# Patient Record
Sex: Male | Born: 1990 | Race: White | Hispanic: No | Marital: Single | Smoking: Never smoker
Health system: Southern US, Community
[De-identification: ages and names within clinical notes are randomized; demographics above are authoritative.]

## PROBLEM LIST (undated history)

## (undated) HISTORY — PX: APPENDECTOMY: SHX54

---
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
## 2014-05-11 ENCOUNTER — Ambulatory Visit: Payer: Self-pay

## 2015-01-15 ENCOUNTER — Emergency Department
Admission: EM | Admit: 2015-01-15 | Discharge: 2015-01-15 | Disposition: A | Discharge status: Home / Self Care | Payer: Self-pay | Attending: Emergency Medicine | Admitting: Emergency Medicine

## 2015-01-15 ENCOUNTER — Other Ambulatory Visit: Payer: Self-pay

## 2015-01-15 ENCOUNTER — Encounter: Payer: Self-pay | Admitting: Emergency Medicine

## 2015-01-15 ENCOUNTER — Emergency Department: Payer: Self-pay

## 2015-01-15 DIAGNOSIS — Y9289 Other specified places as the place of occurrence of the external cause: Secondary | ICD-10-CM | POA: Insufficient documentation

## 2015-01-15 DIAGNOSIS — R1110 Vomiting, unspecified: Secondary | ICD-10-CM | POA: Insufficient documentation

## 2015-01-15 DIAGNOSIS — Y998 Other external cause status: Secondary | ICD-10-CM | POA: Insufficient documentation

## 2015-01-15 DIAGNOSIS — R42 Dizziness and giddiness: Secondary | ICD-10-CM | POA: Insufficient documentation

## 2015-01-15 DIAGNOSIS — R197 Diarrhea, unspecified: Secondary | ICD-10-CM | POA: Insufficient documentation

## 2015-01-15 DIAGNOSIS — Y9389 Activity, other specified: Secondary | ICD-10-CM | POA: Insufficient documentation

## 2015-01-15 DIAGNOSIS — J029 Acute pharyngitis, unspecified: Secondary | ICD-10-CM | POA: Insufficient documentation

## 2015-01-15 LAB — CBC WITH DIFFERENTIAL/PLATELET
Basophils Absolute: 0.1 10*3/uL (ref 0–0.1)
Basophils Relative: 1 %
Eosinophils Absolute: 0 10*3/uL (ref 0–0.7)
Eosinophils Relative: 0 %
HCT: 38.4 % — ABNORMAL LOW (ref 40.0–52.0)
Hemoglobin: 13.2 g/dL (ref 13.0–18.0)
LYMPHS ABS: 0.5 10*3/uL — AB (ref 1.0–3.6)
LYMPHS PCT: 5 %
MCH: 28.4 pg (ref 26.0–34.0)
MCHC: 34.5 g/dL (ref 32.0–36.0)
MCV: 82.4 fL (ref 80.0–100.0)
MONO ABS: 0.7 10*3/uL (ref 0.2–1.0)
Monocytes Relative: 8 %
NEUTROS ABS: 7.9 10*3/uL — AB (ref 1.4–6.5)
NEUTROS PCT: 86 %
PLATELETS: 199 10*3/uL (ref 150–440)
RBC: 4.66 MIL/uL (ref 4.40–5.90)
RDW: 13.3 % (ref 11.5–14.5)
WBC: 9.1 10*3/uL (ref 3.8–10.6)

## 2015-01-15 LAB — BASIC METABOLIC PANEL
Anion gap: 8 (ref 5–15)
BUN: 11 mg/dL (ref 6–20)
CALCIUM: 8.6 mg/dL — AB (ref 8.9–10.3)
CO2: 24 mmol/L (ref 22–32)
CREATININE: 0.98 mg/dL (ref 0.61–1.24)
Chloride: 101 mmol/L (ref 101–111)
GFR calc Af Amer: >60 mL/min (ref >60)
GLUCOSE: 104 mg/dL — AB (ref 65–99)
POTASSIUM: 3.7 mmol/L (ref 3.5–5.1)
SODIUM: 133 mmol/L — AB (ref 135–145)

## 2015-01-15 LAB — MONONUCLEOSIS SCREEN: MONO SCREEN: NEGATIVE

## 2015-01-15 MED ORDER — SODIUM CHLORIDE 0.9 % IV SOLN
Freq: Once | INTRAVENOUS | Status: AC
  Administered 2015-01-15: 19:00:00 via INTRAVENOUS

## 2015-01-15 MED ORDER — IBUPROFEN 800 MG PO TABS: 800.0000 mg | ORAL_TABLET | Freq: Three times a day (TID) | ORAL | Status: AC | PRN

## 2015-01-15 MED ORDER — SODIUM CHLORIDE 0.9 % IV BOLUS (SEPSIS)
1000.0000 mL | Freq: Once | INTRAVENOUS | Status: AC
  Administered 2015-01-15: 1000 mL via INTRAVENOUS

## 2015-01-15 MED ORDER — ONDANSETRON 4 MG PO TBDP
4.0000 mg | ORAL_TABLET | Freq: Once | ORAL | Status: AC
  Administered 2015-01-15: 4 mg via ORAL

## 2015-01-15 MED ORDER — LORAZEPAM 2 MG/ML IJ SOLN
1.0000 mg | Freq: Once | INTRAMUSCULAR | Status: DC
  Filled 2015-01-15: qty 1

## 2015-01-15 MED ORDER — ONDANSETRON HCL 4 MG/2ML IJ SOLN
INTRAMUSCULAR | Status: AC
Start: 2015-01-15 — End: 2015-01-15
  Administered 2015-01-15: 4 mg via INTRAVENOUS
  Filled 2015-01-15: qty 2

## 2015-01-15 MED ORDER — ONDANSETRON 4 MG PO TBDP
ORAL_TABLET | ORAL | Status: AC
Start: 2015-01-15 — End: 2015-01-15
  Administered 2015-01-15: 4 mg via ORAL
  Filled 2015-01-15: qty 1

## 2015-01-15 MED ORDER — ONDANSETRON HCL 4 MG/2ML IJ SOLN: 4.0000 mg | Freq: Once | INTRAMUSCULAR | Status: AC

## 2015-01-15 MED ORDER — ONDANSETRON HCL 4 MG/2ML IJ SOLN
4.0000 mg | Freq: Once | INTRAMUSCULAR | Status: AC
  Administered 2015-01-15: 4 mg via INTRAVENOUS

## 2015-01-15 MED ORDER — ONDANSETRON HCL 4 MG PO TABS: 4.0000 mg | ORAL_TABLET | Freq: Every day | ORAL | Status: AC | PRN

## 2015-01-15 NOTE — ED Notes (Signed)
Pt states he was bit by several yellow jackets yesterday. Pt presents today with fever, dizziness. No resp distress noted

## 2015-01-15 NOTE — ED Notes (Signed)
Lab notified of orders 

## 2015-01-15 NOTE — ED Notes (Signed)
Dr Williams at bedside 

## 2015-01-15 NOTE — ED Provider Notes (Signed)
[facility] Emergency Department Provider Note     Time seen: ----------------------------------------- 6:57 PM on 01/15/2015 -----------------------------------------    I have reviewed the triage vital signs and the nursing notes.   HISTORY  Chief Complaint Insect Bite    HPI Dustin ItoJustin C Dunford Jr. is a 24 y.o. male who presents ER for multiple symptoms including fever, dizziness, room spinning, lightheadedness, vomiting on arrival here. Patient also has had copious diarrhea since this morning. Fever was 102. Patient reports that usually he has bad sore throats, he also sore throat now. Patient initially concerned about being stung by 3 yellow jackets yesterday and some association with his symptoms and the yellowjacket stings. Symptoms are mild to moderate this time.   History reviewed. No pertinent past medical history.  There are no active problems to display for this patient.   Past Surgical History  Procedure Laterality Date  . Appendectomy      Allergies Cephalosporins and Phenergan  Social History History  Substance Use Topics  . Smoking status: Never Smoker   . Smokeless tobacco: Not on file  . Alcohol Use: Yes    Review of Systems Constitutional: Positive for fever Eyes: Negative for visual changes. ENT: Positive for sore throat Cardiovascular: Negative for chest pain. Respiratory: Negative for shortness of breath. Gastrointestinal: Negative for abdominal pain, positive for vomiting and diarrhea Genitourinary: Negative for dysuria. Musculoskeletal: Negative for back pain. Skin: Negative for rash. Neurological: Negative for headaches, positive for weakness and dizziness  10-point ROS otherwise negative.  ____________________________________________   PHYSICAL EXAM:  VITAL SIGNS: ED Triage Vitals  Enc Vitals Group     BP 01/15/15 1809 129/84 mmHg     Pulse Rate 01/15/15 1809 111     Resp --      Temp 01/15/15 1809  102.2 F (39 C)     Temp Source 01/15/15 1809 Oral     SpO2 01/15/15 1809 94 %     Weight 01/15/15 1809 280 lb (127.007 kg)     Height 01/15/15 1809 6\' 4"  (1.93 m)     Head Cir --      Peak Flow --      Pain Score 01/15/15 1812 7     Pain Loc --      Pain Edu? --      Excl. in GC? --     Constitutional: Alert and oriented. Well mild distress, actively vomiting on arrival Eyes: Conjunctivae are normal. PERRL. Normal extraocular movements. ENT   Head: Normocephalic and atraumatic.   Nose: No congestion/rhinnorhea.   Mouth/Throat: Mucous membranes are moist. Markedly tongue or hypertrophy with erythema and exudate   Neck: No stridor. Hematological/Lymphatic/Immunilogical: Mild to moderate cervical adenopathy Cardiovascular: Rapid rate, regular rhythm Normal and symmetric distal pulses are present in all extremities. No murmurs, rubs, or gallops. Respiratory: Normal respiratory effort without tachypnea nor retractions. Breath sounds are clear and equal bilaterally. No wheezes/rales/rhonchi. Gastrointestinal: Soft and nontender. No distention. No abdominal bruits. There is no CVA tenderness. Musculoskeletal: Nontender with normal range of motion in all extremities. No joint effusions.  No lower extremity tenderness nor edema. Neurologic:  Normal speech and language. No gross focal neurologic deficits are appreciated. Speech is normal. No gait instability. Skin:  Skin is warm, dry and intact. No rash noted. Psychiatric: Mood and affect are normal. Speech and behavior are normal. Patient exhibits appropriate insight and judgment. Patient does appear anxious  ____________________________________________  ED COURSE:  Pertinent labs & imaging results that were available  during my care of the patient were reviewed by me and considered in my medical decision making (see chart for details). I do not see any association between the jacket sting in his symptoms today. Possible  gastroenteritis or strep pharyngitis. Patient received normal saline bolus as well as Zofran and Ativan. ____________________________________________ EKG: EKG with normal sinus rhythm, normal axis normal intervals. No evidence of hypertrophy or acute infarction. Rate is 93 bpm   LABS (pertinent positives/negatives)  Labs Reviewed  CBC WITH DIFFERENTIAL/PLATELET - Abnormal; Notable for the following:    HCT 38.4 (*)    Neutro Abs 7.9 (*)    Lymphs Abs 0.5 (*)    All other components within normal limits  BASIC METABOLIC PANEL - Abnormal; Notable for the following:    Sodium 133 (*)    Glucose, Bld 104 (*)    Calcium 8.6 (*)    All other components within normal limits  CULTURE, GROUP A STREP ([facility] ONLY)  MONONUCLEOSIS SCREEN    RADIOLOGY Images were viewed by me  Chest x-ray is unremarkable  ____________________________________________  FINAL ASSESSMENT AND PLAN  Fever, vomiting and diarrhea, pharyngitis  Plan: Patient received saline bolus as well as IV Zofran and Ativan. Tests were negative grossly here. Likely nor virus related. Patient be discharged with antiemetics and antidiarrheal agents. He stable for outpatient follow-up with his doctor.   Emily Filbert, MD   Emily Filbert, MD 01/15/15 [phone]

## 2015-01-15 NOTE — ED Notes (Signed)
Pt took IBU and Benedryl around 515.

## 2015-01-15 NOTE — Discharge Instructions (Signed)
Norovirus Infection Norovirus illness is caused by a viral infection. The term norovirus refers to a group of viruses. Any of those viruses can cause norovirus illness. This illness is often referred to by other names such as viral gastroenteritis, stomach flu, and food poisoning. Anyone can get a norovirus infection. People can have the illness multiple times during their lifetime. CAUSES  Norovirus is found in the stool or vomit of infected people. It is easily spread from person to person (contagious). People with norovirus are contagious from the moment they begin feeling ill. They may remain contagious for as long as 3 days to 2 weeks after recovery. People can become infected with the virus in several ways. This includes:  Eating food or drinking liquids that are contaminated with norovirus.  Touching surfaces or objects contaminated with norovirus, and then placing your hand in your mouth.  Having direct contact with a person who is infected and shows symptoms. This may occur while caring for someone with illness or while sharing foods or eating utensils with someone who is ill. SYMPTOMS  Symptoms usually begin 1 to 2 days after ingestion of the virus. Symptoms may include:  Nausea.  Vomiting.  Diarrhea.  Stomach cramps.  Low-grade fever.  Chills.  Headache.  Muscle aches.  Tiredness. Most people with norovirus illness get better within 1 to 2 days. Some people become dehydrated because they cannot drink enough liquids to replace those lost from vomiting and diarrhea. This is especially true for young children, the elderly, and others who are unable to care for themselves. DIAGNOSIS  Diagnosis is based on your symptoms and exam. Currently, only state public health laboratories have the ability to test for norovirus in stool or vomit. TREATMENT  No specific treatment exists for norovirus infections. No vaccine is available to prevent infections. Norovirus illness is usually  brief in healthy people. If you are ill with vomiting and diarrhea, you should drink enough water and fluids to keep your urine clear or pale yellow. Dehydration is the most serious health effect that can result from this infection. By drinking oral rehydration solution (ORS), people can reduce their chance of becoming dehydrated. There are many commercially available pre-made and powdered ORS designed to safely rehydrate people. These may be recommended by your caregiver. Replace any new fluid losses from diarrhea or vomiting with ORS as follows:  If your child weighs 10 kg or less (22 lb or less), give 60 to 120 ml ( to  cup or 2 to 4 oz) of ORS for each diarrheal stool or vomiting episode.  If your child weighs more than 10 kg (more than 22 lb), give 120 to 240 ml ( to 1 cup or 4 to 8 oz) of ORS for each diarrheal stool or vomiting episode. HOME CARE INSTRUCTIONS   Follow all your caregiver's instructions.  Avoid sugar-free and alcoholic drinks while ill.  Only take over-the-counter or prescription medicines for pain, vomiting, diarrhea, or fever as directed by your caregiver. You can decrease your chances of coming in contact with norovirus or spreading it by following these steps:  Frequently wash your hands, especially after using the toilet, changing diapers, and before eating or preparing food.  Carefully wash fruits and vegetables. Cook shellfish before eating them.  Do not prepare food for others while you are infected and for at least 3 days after recovering from illness.  Thoroughly clean and disinfect contaminated surfaces immediately after an episode of illness using a bleach-based household cleaner.    Immediately remove and wash clothing or linens that may be contaminated with the virus.  Use the toilet to dispose of any vomit or stool. Make sure the surrounding area is kept clean.  Food that may have been contaminated by an ill person should be discarded. SEEK IMMEDIATE  MEDICAL CARE IF:   You develop symptoms of dehydration that do not improve with fluid replacement. This may include:  Excessive sleepiness.  Lack of tears.  Dry mouth.  Dizziness when standing.  Weak pulse. Document Released: 09/11/2002 Document Revised: 09/13/2011 Document Reviewed: 10/13/2009 Exit[employer] Patient Information 2015 [employer]. This information is not intended to replace advice given to you by your health care provider. Make sure you discuss any questions you have with your health care provider.  

## 2015-01-15 NOTE — ED Notes (Signed)
Strep POC NEGATIVE

## 2015-01-18 LAB — CULTURE, GROUP A STREP (THRC)

## 2021-04-23 ENCOUNTER — Ambulatory Visit: Payer: Self-pay | Admitting: Dermatology

## 2022-11-16 ENCOUNTER — Ambulatory Visit
Admission: RE | Admit: 2022-11-16 | Discharge: 2022-11-16 | Disposition: A | Discharge status: Home / Self Care | Payer: BLUE CROSS/BLUE SHIELD | Source: Ambulatory Visit | Attending: Emergency Medicine | Admitting: Emergency Medicine

## 2022-11-16 VITALS — BP 149/91 | HR 89 | Temp 97.7°F | Resp 18

## 2022-11-16 DIAGNOSIS — R062 Wheezing: Secondary | ICD-10-CM

## 2022-11-16 DIAGNOSIS — J069 Acute upper respiratory infection, unspecified: Secondary | ICD-10-CM | POA: Diagnosis not present

## 2022-11-16 DIAGNOSIS — R051 Acute cough: Secondary | ICD-10-CM

## 2022-11-16 MED ORDER — IPRATROPIUM BROMIDE 0.06 % NA SOLN: 2.0000 | Freq: Four times a day (QID) | NASAL | 12 refills | Status: AC

## 2022-11-16 MED ORDER — BENZONATATE 100 MG PO CAPS: 200.0000 mg | ORAL_CAPSULE | Freq: Three times a day (TID) | ORAL | 0 refills | Status: AC

## 2022-11-16 MED ORDER — AMOXICILLIN-POT CLAVULANATE 875-125 MG PO TABS: 1.0000 | ORAL_TABLET | Freq: Two times a day (BID) | ORAL | 0 refills | Status: AC

## 2022-11-16 MED ORDER — ALBUTEROL SULFATE HFA 108 (90 BASE) MCG/ACT IN AERS: 2.0000 | INHALATION_SPRAY | RESPIRATORY_TRACT | 0 refills | Status: AC | PRN

## 2022-11-16 MED ORDER — AEROCHAMBER MV MISC: 2 refills | Status: AC

## 2022-11-16 NOTE — ED Provider Notes (Signed)
[facility]    CSN: [id number] Arrival date & time: 11/16/22  1546      History   Chief Complaint Chief Complaint  Patient presents with   Cough    Ongoing cough with congestion and wheezing at night time - Entered by patient   Wheezing    HPI Dustin Payne. is a 32 y.o. male.   HPI  32 year old male with no significant past medical history presents for evaluation of 3 weeks worth of runny nose, nasal congestion, yellow nasal discharge, productive cough yellow sputum, shortness breath, and wheezing.  He denies fever, sinus pain, or headache.  He has been using a friend's DuoNeb to help with wheezing at night with relief of symptoms.  No history of asthma.  History reviewed. No pertinent past medical history.  There are no problems to display for this patient.   Past Surgical History:  Procedure Laterality Date   APPENDECTOMY         Home Medications    Prior to Admission medications   Medication Sig Start Date End Date Taking? Authorizing Provider  albuterol (VENTOLIN HFA) 108 (90 Base) MCG/ACT inhaler Inhale 2 puffs into the lungs every 4 (four) hours as needed. 11/16/22  Yes Becky Augusta, NP  amoxicillin-clavulanate (AUGMENTIN) 875-125 MG tablet Take 1 tablet by mouth every 12 (twelve) hours for 10 days. 11/16/22 11/26/22 Yes Becky Augusta, NP  benzonatate (TESSALON) 100 MG capsule Take 2 capsules (200 mg total) by mouth every 8 (eight) hours. 11/16/22  Yes Becky Augusta, NP  ipratropium (ATROVENT) 0.06 % nasal spray Place 2 sprays into both nostrils 4 (four) times daily. 11/16/22  Yes Becky Augusta, NP  Spacer/Aero-Holding Chambers (AEROCHAMBER MV) inhaler Use as instructed 11/16/22  Yes Becky Augusta, NP  ibuprofen (ADVIL,MOTRIN) 800 MG tablet Take 1 tablet (800 mg total) by mouth every 8 (eight) hours as needed. 01/15/15   Emily Filbert, MD  ondansetron (ZOFRAN) 4 MG tablet Take 1 tablet (4 mg total) by mouth daily as needed for nausea or vomiting.  01/15/15   Emily Filbert, MD    Family History History reviewed. No pertinent family history.  Social History Social History   Tobacco Use   Smoking status: Never   Smokeless tobacco: Never  Vaping Use   Vaping Use: Never used  Substance Use Topics   Alcohol use: Not Currently    Comment: last drink 12 weeks ago.   Drug use: Never     Allergies   Cefprozil, Cephalosporins, and Phenergan [promethazine hcl]   Review of Systems Review of Systems  Constitutional:  Negative for fever.  HENT:  Positive for congestion and rhinorrhea. Negative for ear pain, sinus pain and sore throat.   Respiratory:  Positive for cough, shortness of breath and wheezing.      Physical Exam Triage Vital Signs ED Triage Vitals [11/16/22 1609]  Enc Vitals Group     BP (!) 149/91     Pulse Rate 89     Resp 18     Temp 97.7 F (36.5 C)     Temp Source Temporal     SpO2 94 %     Weight      Height      Head Circumference      Peak Flow      Pain Score 0     Pain Loc      Pain Edu?      Excl. in GC?    No data found.  Updated Vital Signs BP (!) 149/91 (BP Location: Left Arm)   Pulse 89   Temp 97.7 F (36.5 C) (Temporal)   Resp 18   SpO2 94%   Visual Acuity Right Eye Distance:   Left Eye Distance:   Bilateral Distance:    Right Eye Near:   Left Eye Near:    Bilateral Near:     Physical Exam Vitals and nursing note reviewed.  Constitutional:      Appearance: Normal appearance. He is not ill-appearing.  HENT:     Head: Normocephalic and atraumatic.     Right Ear: Tympanic membrane, ear canal and external ear normal. There is no impacted cerumen.     Left Ear: Tympanic membrane, ear canal and external ear normal. There is no impacted cerumen.     Nose: Congestion and rhinorrhea present.     Comments: Nasal mucosa is erythematous and edematous with yellow discharge in both nares.    Mouth/Throat:     Mouth: Mucous membranes are moist.     Pharynx: Oropharynx is  clear. Posterior oropharyngeal erythema present. No oropharyngeal exudate.     Comments: Erythema to the posterior oropharynx with yellow postnasal drip.  No injection. Cardiovascular:     Rate and Rhythm: Normal rate and regular rhythm.     Pulses: Normal pulses.     Heart sounds: Normal heart sounds. No murmur heard.    No friction rub. No gallop.  Pulmonary:     Effort: Pulmonary effort is normal.     Breath sounds: Normal breath sounds. No wheezing, rhonchi or rales.  Musculoskeletal:     Cervical back: Normal range of motion and neck supple.  Lymphadenopathy:     Cervical: No cervical adenopathy.  Skin:    General: Skin is warm and dry.     Capillary Refill: Capillary refill takes less than 2 seconds.     Findings: No erythema or rash.  Neurological:     General: No focal deficit present.     Mental Status: He is alert and oriented to person, place, and time.      UC Treatments / Results  Labs (all labs ordered are listed, but only abnormal results are displayed) Labs Reviewed - No data to display  EKG   Radiology No results found.  Procedures Procedures (including critical care time)  Medications Ordered in UC Medications - No data to display  Initial Impression / Assessment and Plan / UC Course  I have reviewed the triage vital signs and the nursing notes.  Pertinent labs & imaging results that were available during my care of the patient were reviewed by me and considered in my medical decision making (see chart for details).   Patient is a nontoxic-appearing 32 year old male presenting for evaluation of 3 for the respiratory symptoms as outlined in HPI above.  The patient is not in any respiratory distress and he can speak in full sentence without dyspnea or tachypnea.  His respiratory rate is 18 and his room air oxygen saturation is 94%.  His symptoms have gone in conjunction with upper respiratory symptoms and he initially attributed this to allergies.  He  states when the pollen count is high his respiratory symptoms are worse but he has no history of asthma.  He does have inflamed nasal mucosa with purulent nasal discharge and yellow postnasal drip.  His lungs are clear to auscultation.  I suspect that he has an upper respiratory infection and the drainage is what is causing his  lower respiratory symptoms.  He has an allergy to cephalosporins but he states he can take penicillin fine so I will put him on Augmentin 875 twice daily for 10 days for treatment of an upper respiratory infection.  I will prescribe albuterol inhaler and spacer that he can use every 4-6 hours as needed for shortness of breath or wheezing.  I have also prescribed Tessalon Perles and Atrovent nasal spray to help with cough and nasal congestion.  He has an allergy to Promethazine DM cough syrup so I recommended he use over-the-counter cough preparations such as Delsym, Robitussin, or Zarbee's.  Return precautions reviewed.   Final Clinical Impressions(s) / UC Diagnoses   Final diagnoses:  Upper respiratory tract infection, unspecified type  Acute cough  Wheezing     Discharge Instructions      The Augmentin twice daily with food for 10 days for treatment of your uri.  Perform sinus irrigation 2-3 times a day with a NeilMed sinus rinse kit and distilled water.  Do not use tap water.  You can use plain over-the-counter Mucinex every 6 hours to break up the stickiness of the mucus so your body can clear it.  Increase your oral fluid intake to thin out your mucus so that is also able for your body to clear more easily.  Take an over-the-counter probiotic, such as Culturelle-align-activia, 1 hour after each dose of antibiotic to prevent diarrhea.  Use the Atrovent nasal spray, 2 squirts in each nostril every 6 hours, as needed for runny nose and postnasal drip.  Use the Tessalon Perles every 8 hours during the day.  Take them with a small sip of water.  They may give you  some numbness to the base of your tongue or a metallic taste in your mouth, this is normal.  Use the Albuterol inhaler with the spacer, 2 puffs every 4-6 hours as needed for wheezing and cough.  Return for reevaluation or see your primary care provider for any new or worsening symptoms.   If you develop any new or worsening symptoms return for reevaluation or see your primary care provider.      ED Prescriptions     Medication Sig Dispense Auth. Provider   amoxicillin-clavulanate (AUGMENTIN) 875-125 MG tablet Take 1 tablet by mouth every 12 (twelve) hours for 10 days. 20 tablet Becky Augusta, NP   ipratropium (ATROVENT) 0.06 % nasal spray Place 2 sprays into both nostrils 4 (four) times daily. 15 mL Becky Augusta, NP   benzonatate (TESSALON) 100 MG capsule Take 2 capsules (200 mg total) by mouth every 8 (eight) hours. 21 capsule Becky Augusta, NP   albuterol (VENTOLIN HFA) 108 (90 Base) MCG/ACT inhaler Inhale 2 puffs into the lungs every 4 (four) hours as needed. 18 g Becky Augusta, NP   Spacer/Aero-Holding Chambers (AEROCHAMBER MV) inhaler Use as instructed 1 each Becky Augusta, NP      PDMP not reviewed this encounter.   Becky Augusta, NP 11/16/22 [phone]

## 2022-11-16 NOTE — Discharge Instructions (Signed)
The Augmentin twice daily with food for 10 days for treatment of your uri.  Perform sinus irrigation 2-3 times a day with a NeilMed sinus rinse kit and distilled water.  Do not use tap water.  You can use plain over-the-counter Mucinex every 6 hours to break up the stickiness of the mucus so your body can clear it.  Increase your oral fluid intake to thin out your mucus so that is also able for your body to clear more easily.  Take an over-the-counter probiotic, such as Culturelle-align-activia, 1 hour after each dose of antibiotic to prevent diarrhea.  Use the Atrovent nasal spray, 2 squirts in each nostril every 6 hours, as needed for runny nose and postnasal drip.  Use the Tessalon Perles every 8 hours during the day.  Take them with a small sip of water.  They may give you some numbness to the base of your tongue or a metallic taste in your mouth, this is normal.  Use the Albuterol inhaler with the spacer, 2 puffs every 4-6 hours as needed for wheezing and cough.  Return for reevaluation or see your primary care provider for any new or worsening symptoms.   If you develop any new or worsening symptoms return for reevaluation or see your primary care provider.

## 2022-11-16 NOTE — ED Triage Notes (Signed)
Patient presents to UC for cough, congestion, and wheezing x 21 days. No hx of asthma.  Treating symptoms with nyquil. States he used his friends albuterol.
